# Patient Record
Sex: Female | Born: 1970 | Race: Black or African American | Hispanic: No | Marital: Single | Smoking: Never smoker
Health system: Southern US, Community
[De-identification: ages and names within clinical notes are randomized; demographics above are authoritative.]

## PROBLEM LIST (undated history)

## (undated) HISTORY — PX: ORTHOPEDIC SURGERY: SHX850

## (undated) HISTORY — PX: CHOLECYSTECTOMY: SHX55

---

## 2005-09-21 ENCOUNTER — Ambulatory Visit: Payer: Self-pay

## 2007-06-06 ENCOUNTER — Ambulatory Visit: Payer: Self-pay | Admitting: Family Medicine

## 2008-04-23 ENCOUNTER — Ambulatory Visit: Payer: Self-pay

## 2009-02-24 ENCOUNTER — Ambulatory Visit: Payer: Self-pay | Admitting: Family Medicine

## 2009-02-25 ENCOUNTER — Ambulatory Visit: Payer: Self-pay | Admitting: Surgery

## 2010-02-18 ENCOUNTER — Ambulatory Visit: Payer: Self-pay

## 2010-10-11 ENCOUNTER — Ambulatory Visit: Payer: Self-pay | Admitting: General Practice

## 2010-10-14 ENCOUNTER — Ambulatory Visit: Payer: Self-pay

## 2011-04-11 ENCOUNTER — Ambulatory Visit: Payer: Self-pay

## 2012-05-28 ENCOUNTER — Ambulatory Visit: Payer: Self-pay | Admitting: Obstetrics and Gynecology

## 2013-03-21 IMAGING — CT CT OF THE RIGHT ELBOW WITHOUT CONTRAST
2 of 3 series · 15 of 29 positions shown, 19 images · non-contrast
Comparison: none

REASON FOR EXAM: radial head fracture  3D reconstruction  CR  [phone]
COMMENTS:

PROCEDURE:     CT  - CT ELBOW RIGHT WO  - October 14, 2010 [DATE]
RESULT:     Comparison: Elbow radiographs 10/11/2010
TECHNIQUE: Multiple axial images obtained of the elbow, without intravenous
contrast. Coronal, sagittal, and 3-D reconstructions were performed.

[Series 3: bone windows · axial · 0.35mm/px · z∈[+48,+159]mm · 10 of 45 slices shown, 13 images]
[im 4/45  soft-tissue]
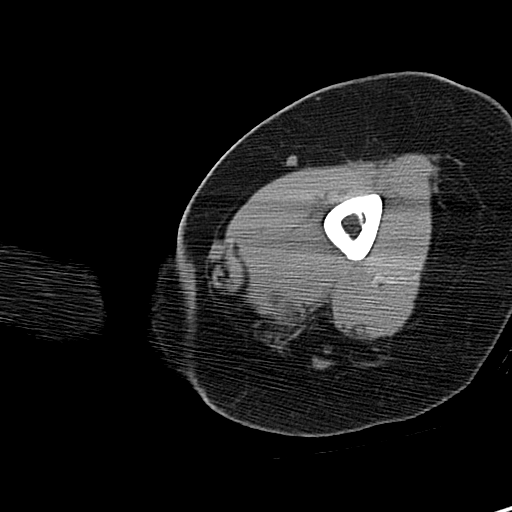
[im 4/45  bone]
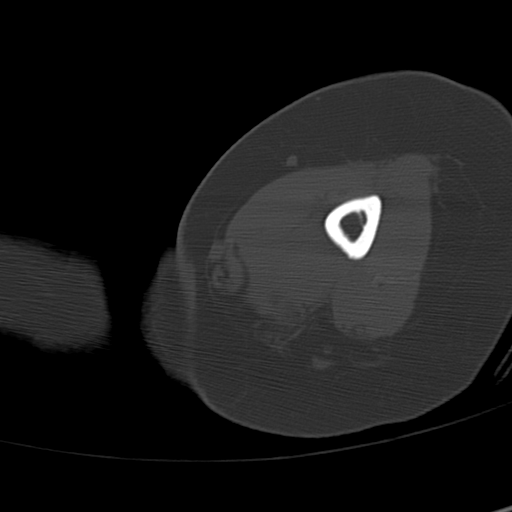
[im 8/45  bone]
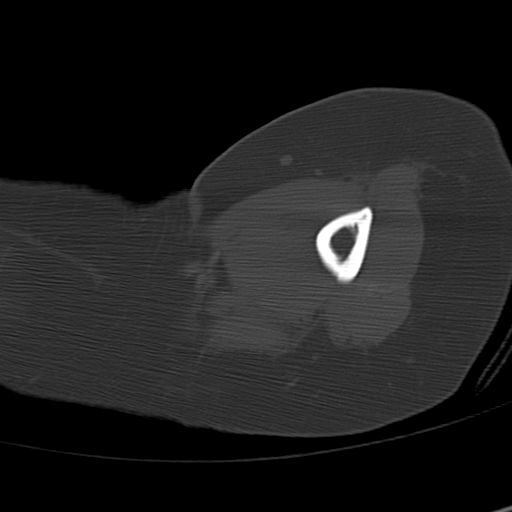
[im 12/45  bone]
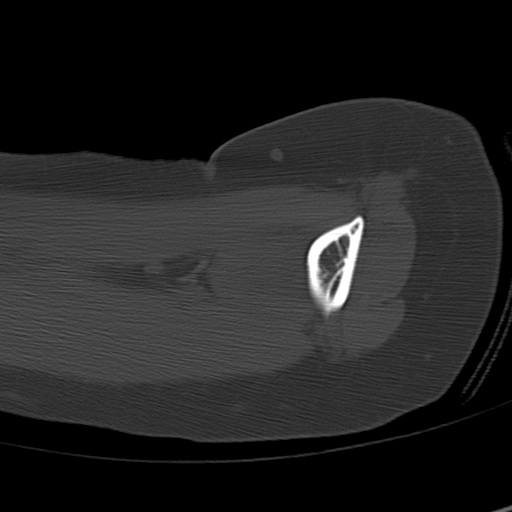
[im 15/45  bone]
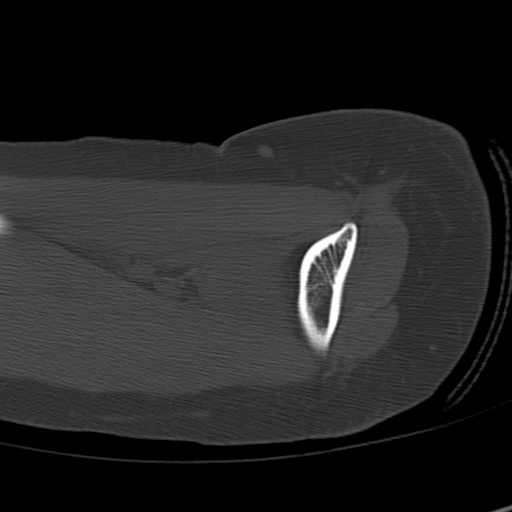
[im 19/45  soft-tissue]
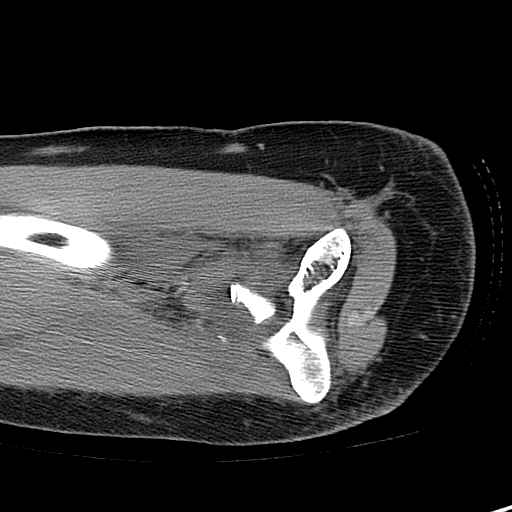
[im 19/45  bone]
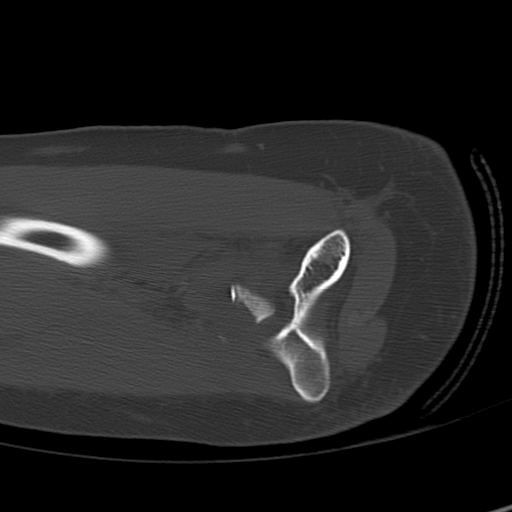
[im 26/45  bone]
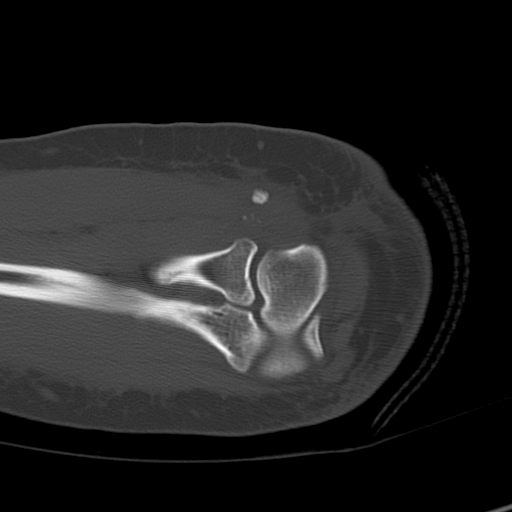
[im 30/45  bone]
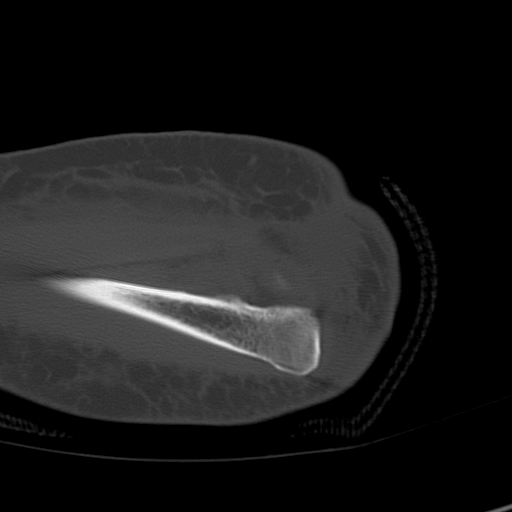
[im 34/45  bone]
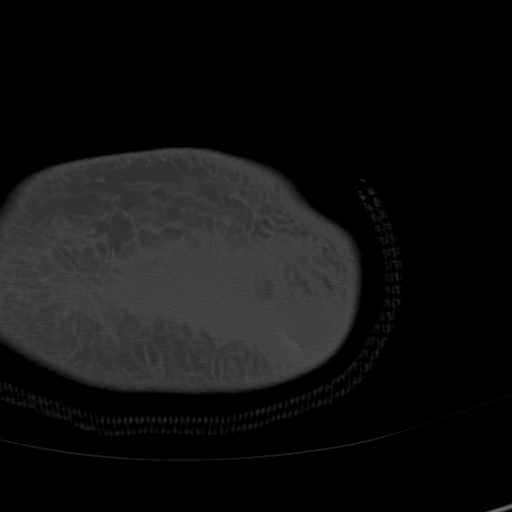
[im 37/45  soft-tissue]
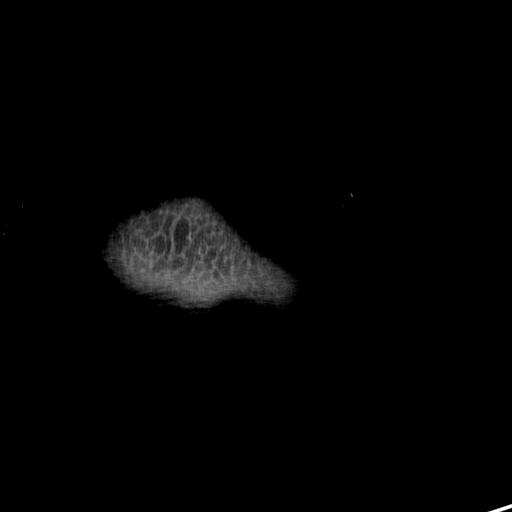
[im 37/45  bone]
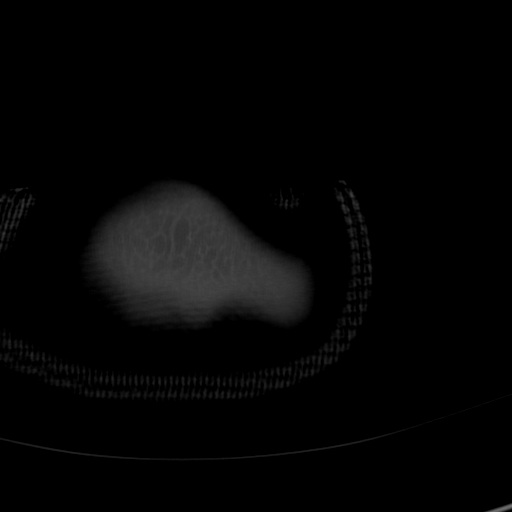
[im 41/45  bone]
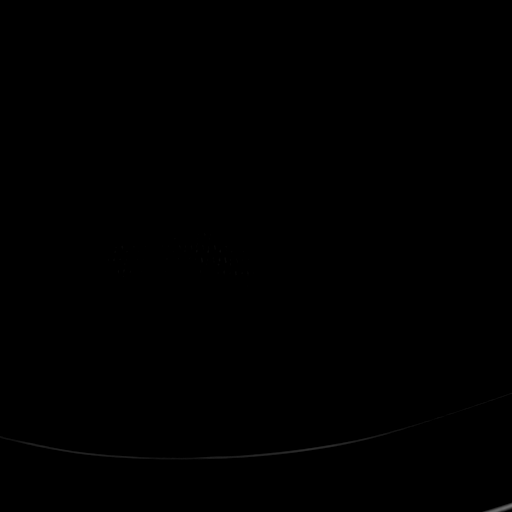

[Series 7: sag · sagittal · 0.24mm/px · 5 of 139 slices shown, 6 images]
[im 47/139  bone]
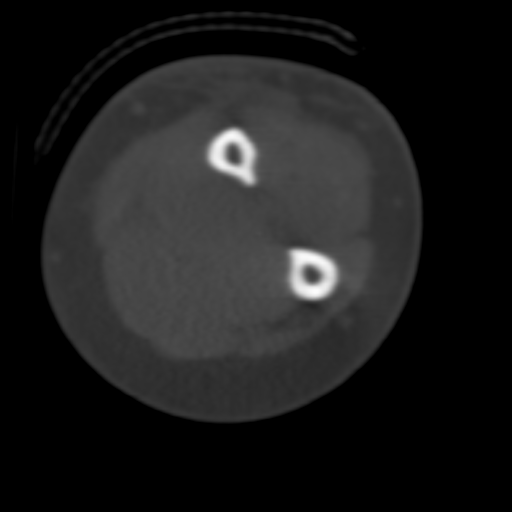
[im 58/139  bone]
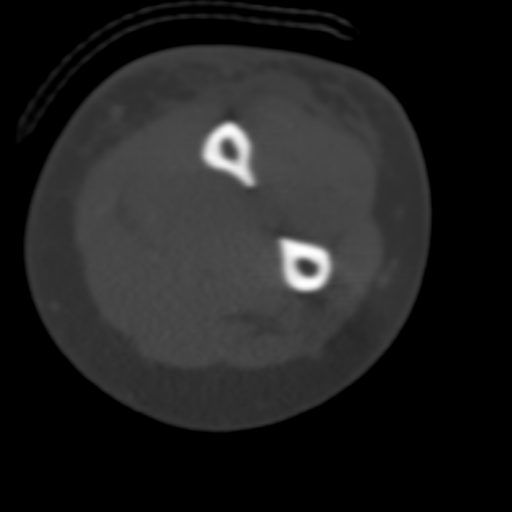
[im 70/139  soft-tissue]
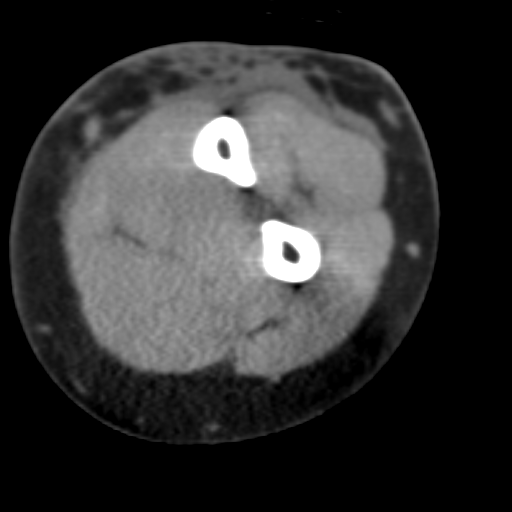
[im 70/139  bone]
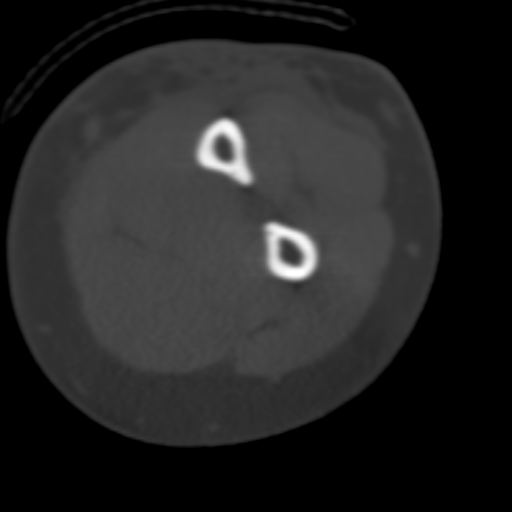
[im 81/139  bone]
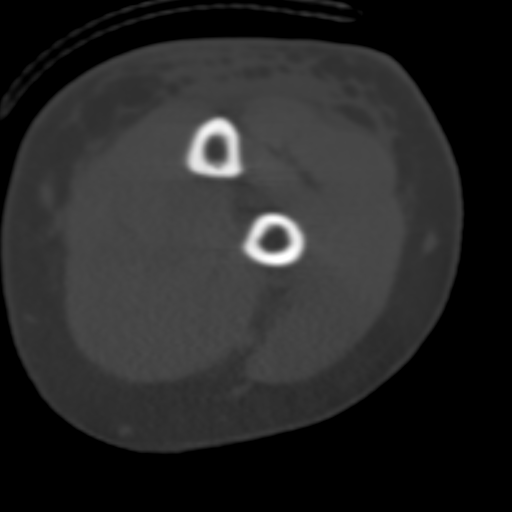
[im 93/139  bone]
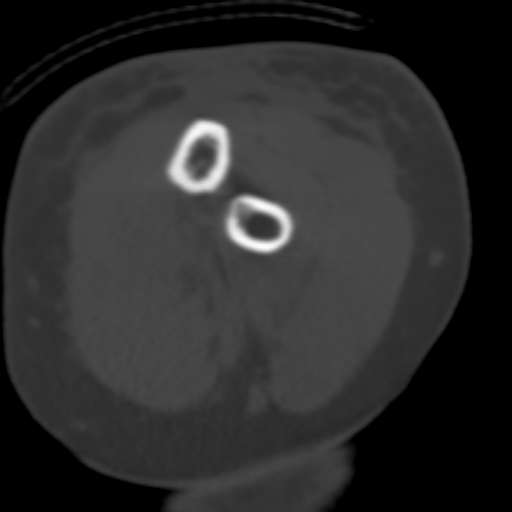

[15 of 29 positions shown; findings below may reference images not displayed]

FINDINGS: There is a comminuted, displaced fracture of the radial head. A large
fragment of the radial head articular surface is displaced anteriorly, and
rotated. Additionally, there is a displaced fracture of the radial side of
the coronoid process. The fracture fragment is displaced anteriorly.

There are several small fracture fragments in the soft tissues lateral to
the radial head.
IMPRESSION: 1. Comminuted, displaced intra-articular radial head fracture.
2. Displaced fracture of the coronoid process.

## 2013-05-29 ENCOUNTER — Ambulatory Visit: Payer: Self-pay | Admitting: Obstetrics and Gynecology

## 2014-05-12 ENCOUNTER — Other Ambulatory Visit: Payer: Self-pay | Admitting: Obstetrics and Gynecology

## 2014-05-12 DIAGNOSIS — Z1231 Encounter for screening mammogram for malignant neoplasm of breast: Secondary | ICD-10-CM

## 2014-06-01 ENCOUNTER — Ambulatory Visit
Admission: RE | Admit: 2014-06-01 | Discharge: 2014-06-01 | Disposition: A | Discharge status: Home / Self Care | Payer: 59 | Source: Ambulatory Visit | Attending: Obstetrics and Gynecology | Admitting: Obstetrics and Gynecology

## 2014-06-01 DIAGNOSIS — Z1231 Encounter for screening mammogram for malignant neoplasm of breast: Secondary | ICD-10-CM | POA: Insufficient documentation

## 2015-07-26 ENCOUNTER — Other Ambulatory Visit: Payer: Self-pay | Admitting: Obstetrics and Gynecology

## 2015-07-26 DIAGNOSIS — Z1231 Encounter for screening mammogram for malignant neoplasm of breast: Secondary | ICD-10-CM

## 2015-08-12 ENCOUNTER — Other Ambulatory Visit: Payer: Self-pay | Admitting: Obstetrics and Gynecology

## 2015-08-12 ENCOUNTER — Ambulatory Visit
Admission: RE | Admit: 2015-08-12 | Discharge: 2015-08-12 | Disposition: A | Discharge status: Home / Self Care | Payer: 59 | Source: Ambulatory Visit | Attending: Obstetrics and Gynecology | Admitting: Obstetrics and Gynecology

## 2015-08-12 DIAGNOSIS — Z1231 Encounter for screening mammogram for malignant neoplasm of breast: Secondary | ICD-10-CM

## 2016-08-28 ENCOUNTER — Other Ambulatory Visit: Payer: Self-pay | Admitting: Obstetrics and Gynecology

## 2016-08-28 DIAGNOSIS — Z1231 Encounter for screening mammogram for malignant neoplasm of breast: Secondary | ICD-10-CM

## 2016-08-31 ENCOUNTER — Ambulatory Visit
Admission: RE | Admit: 2016-08-31 | Discharge: 2016-08-31 | Disposition: A | Discharge status: Home / Self Care | Payer: 59 | Source: Ambulatory Visit | Attending: Obstetrics and Gynecology | Admitting: Obstetrics and Gynecology

## 2016-08-31 DIAGNOSIS — Z1231 Encounter for screening mammogram for malignant neoplasm of breast: Secondary | ICD-10-CM | POA: Diagnosis present

## 2017-03-24 ENCOUNTER — Ambulatory Visit
Admission: EM | Admit: 2017-03-24 | Discharge: 2017-03-24 | Disposition: A | Discharge status: Home / Self Care | Payer: Managed Care, Other (non HMO) | Attending: Family Medicine | Admitting: Family Medicine

## 2017-03-24 ENCOUNTER — Other Ambulatory Visit: Payer: Self-pay

## 2017-03-24 DIAGNOSIS — R0981 Nasal congestion: Secondary | ICD-10-CM

## 2017-03-24 DIAGNOSIS — R509 Fever, unspecified: Secondary | ICD-10-CM

## 2017-03-24 DIAGNOSIS — R05 Cough: Secondary | ICD-10-CM

## 2017-03-24 DIAGNOSIS — J111 Influenza due to unidentified influenza virus with other respiratory manifestations: Secondary | ICD-10-CM

## 2017-03-24 LAB — RAPID INFLUENZA A&B ANTIGENS: Influenza B (ARMC): NEGATIVE

## 2017-03-24 LAB — RAPID INFLUENZA A&B ANTIGENS (ARMC ONLY): INFLUENZA A (ARMC): POSITIVE — AB

## 2017-03-24 MED ORDER — ACETAMINOPHEN 500 MG PO TABS
1000.0000 mg | ORAL_TABLET | Freq: Once | ORAL | Status: AC
  Administered 2017-03-24: 1000 mg via ORAL

## 2017-03-24 MED ORDER — BENZONATATE 200 MG PO CAPS: ORAL_CAPSULE | ORAL | 0 refills | Status: AC

## 2017-03-24 MED ORDER — OSELTAMIVIR PHOSPHATE 75 MG PO CAPS: 75.0000 mg | ORAL_CAPSULE | Freq: Two times a day (BID) | ORAL | 0 refills | Status: AC

## 2017-03-24 NOTE — ED Triage Notes (Signed)
Pt with fever, cough, runny nose and chest congestion.

## 2017-03-24 NOTE — ED Provider Notes (Signed)
[facility]    CSN: [id number] Arrival date & time: 03/24/17  0802     History   Chief Complaint Chief Complaint  Patient presents with  . Fever    HPI Renee Juarez is a 47 y.o. female.   HPI  [age] year old female presents with the sudden onset yesterday of fever cough runny nose and chest congestion.  Works at [employer] and states that there has been an epidemic of flu in her office.  Does not have much body ache but certainly has the respiratory component and chest congestion.  Fever of 103.1 with a pulse rate of 115.  O2 sats on room air is 100%.  She did not receive her flu shot this year is never had the flu.      History reviewed. No pertinent past medical history.  There are no active problems to display for this patient.   Past Surgical History:  Procedure Laterality Date  . CHOLECYSTECTOMY    . ORTHOPEDIC SURGERY      OB History    No data available       Home Medications    Prior to Admission medications   Medication Sig Start Date End Date Taking? Authorizing Provider  levonorgestrel (MIRENA) 20 MCG/24HR IUD 1 each by Intrauterine route once.   Yes [provider]  benzonatate (TESSALON) 200 MG capsule Take one cap TID PRN cough 03/24/17   Lutricia Feiloemer, Skylynne Schlechter P, PA-C  oseltamivir (TAMIFLU) 75 MG capsule Take 1 capsule (75 mg total) by mouth every 12 (twelve) hours. 03/24/17   Lutricia Feiloemer, Jazia Faraci P, PA-C    Family History Family History  Problem Relation Age of Onset  . Breast cancer Maternal Aunt        under 50  . Breast cancer Mother        under 50  . Breast cancer Maternal Grandmother        over 4250    Social History Social History   Tobacco Use  . Smoking status: Never Smoker  . Smokeless tobacco: Never Used  Substance Use Topics  . Alcohol use: No    Frequency: Never  . Drug use: No     Allergies   Penicillins   Review of Systems Review of Systems  Constitutional: Positive for activity change, chills, fatigue  and fever.  HENT: Positive for congestion.   Respiratory: Positive for cough.   All other systems reviewed and are negative.    Physical Exam Triage Vital Signs ED Triage Vitals  Enc Vitals Group     BP 03/24/17 0821 125/72     Pulse Rate 03/24/17 0821 (!) 115     Resp 03/24/17 0821 20     Temp 03/24/17 0821 (!) 103.1 F (39.5 C)     Temp Source 03/24/17 0821 Oral     SpO2 03/24/17 0821 100 %     Weight 03/24/17 0822 240 lb (108.9 kg)     Height 03/24/17 0822 5\' 6"  (1.676 m)     Head Circumference --      Peak Flow --      Pain Score --      Pain Loc --      Pain Edu? --      Excl. in GC? --    No data found.  Updated Vital Signs BP 125/72 (BP Location: Left Arm)   Pulse (!) 115   Temp (!) 103.1 F (39.5 C) (Oral)   Resp 20   Ht 5\' 6"  (  1.676 m)   Wt 240 lb (108.9 kg)   SpO2 100%   BMI 38.74 kg/m   Visual Acuity Right Eye Distance:   Left Eye Distance:   Bilateral Distance:    Right Eye Near:   Left Eye Near:    Bilateral Near:     Physical Exam  Constitutional: She is oriented to person, place, and time. She appears well-developed and well-nourished. No distress.  HENT:  Head: Normocephalic.  Right Ear: External ear normal.  Left Ear: External ear normal.  Nose: Nose normal.  Mouth/Throat: Oropharynx is clear and moist. No oropharyngeal exudate.  Eyes: Pupils are equal, round, and reactive to light. Right eye exhibits no discharge. Left eye exhibits no discharge.  Neck: Normal range of motion.  Pulmonary/Chest: Effort normal and breath sounds normal.  Musculoskeletal: Normal range of motion.  Lymphadenopathy:    She has no cervical adenopathy.  Neurological: She is alert and oriented to person, place, and time.  Skin: Skin is warm and dry. She is not diaphoretic.  Psychiatric: She has a normal mood and affect. Her behavior is normal. Judgment and thought content normal.  Nursing note and vitals reviewed.    UC Treatments / Results  Labs (all  labs ordered are listed, but only abnormal results are displayed) Labs Reviewed  RAPID INFLUENZA A&B ANTIGENS ([facility] ONLY) - Abnormal; Notable for the following components:      Result Value   Influenza A ([facility]) POSITIVE (*)    All other components within normal limits    EKG  EKG Interpretation None       Radiology No results found.  Procedures Procedures (including critical care time)  Medications Ordered in UC Medications  acetaminophen (TYLENOL) tablet 1,000 mg (1,000 mg Oral Given 03/24/17 1610)     Initial Impression / Assessment and Plan / UC Course  I have reviewed the triage vital signs and the nursing notes.  Pertinent labs & imaging results that were available during my care of the patient were reviewed by me and considered in my medical decision making (see chart for details).     Plan: 1. Test/x-ray results and diagnosis reviewed with patient 2. rx as per orders; risks, benefits, potential side effects reviewed with patient 3. Recommend supportive treatment with rest and fluids.  Tylenol or Motrin for body aches and fever.  She has opted to Tamiflu.  Worsens she should go to the emergency room or return to our clinic as necessary.  Remain out of work until her fever has broken 4. F/u prn if symptoms worsen or don't improve   Final Clinical Impressions(s) / UC Diagnoses   Final diagnoses:  Influenza    ED Discharge Orders        Ordered    benzonatate (TESSALON) 200 MG capsule     03/24/17 0840    oseltamivir (TAMIFLU) 75 MG capsule  Every 12 hours     03/24/17 0840       Controlled Substance Prescriptions [place] Controlled Substance Registry consulted? Not Applicable   Lutricia Feil, PA-C 03/24/17 9604

## 2017-08-29 ENCOUNTER — Other Ambulatory Visit: Payer: Self-pay | Admitting: Obstetrics and Gynecology

## 2017-08-29 DIAGNOSIS — Z1231 Encounter for screening mammogram for malignant neoplasm of breast: Secondary | ICD-10-CM

## 2017-09-18 ENCOUNTER — Ambulatory Visit
Admission: RE | Admit: 2017-09-18 | Discharge: 2017-09-18 | Disposition: A | Discharge status: Home / Self Care | Payer: Managed Care, Other (non HMO) | Source: Ambulatory Visit | Attending: Obstetrics and Gynecology | Admitting: Obstetrics and Gynecology

## 2017-09-18 DIAGNOSIS — Z1231 Encounter for screening mammogram for malignant neoplasm of breast: Secondary | ICD-10-CM | POA: Diagnosis present

## 2018-08-14 ENCOUNTER — Other Ambulatory Visit: Payer: Self-pay | Admitting: Obstetrics and Gynecology

## 2018-08-14 DIAGNOSIS — Z1231 Encounter for screening mammogram for malignant neoplasm of breast: Secondary | ICD-10-CM

## 2018-09-20 ENCOUNTER — Ambulatory Visit
Admission: RE | Admit: 2018-09-20 | Discharge: 2018-09-20 | Disposition: A | Discharge status: Home / Self Care | Payer: Managed Care, Other (non HMO) | Source: Ambulatory Visit | Attending: Obstetrics and Gynecology | Admitting: Obstetrics and Gynecology

## 2018-09-20 DIAGNOSIS — Z1231 Encounter for screening mammogram for malignant neoplasm of breast: Secondary | ICD-10-CM | POA: Insufficient documentation

## 2018-09-25 ENCOUNTER — Other Ambulatory Visit: Payer: Self-pay | Admitting: Obstetrics and Gynecology

## 2018-09-25 DIAGNOSIS — R928 Other abnormal and inconclusive findings on diagnostic imaging of breast: Secondary | ICD-10-CM

## 2018-09-25 DIAGNOSIS — N6320 Unspecified lump in the left breast, unspecified quadrant: Secondary | ICD-10-CM

## 2018-10-03 ENCOUNTER — Other Ambulatory Visit: Payer: Self-pay | Admitting: Obstetrics and Gynecology

## 2018-10-03 ENCOUNTER — Ambulatory Visit
Admission: RE | Admit: 2018-10-03 | Discharge: 2018-10-03 | Disposition: A | Discharge status: Home / Self Care | Payer: Managed Care, Other (non HMO) | Source: Ambulatory Visit | Attending: Obstetrics and Gynecology | Admitting: Obstetrics and Gynecology

## 2018-10-03 DIAGNOSIS — R928 Other abnormal and inconclusive findings on diagnostic imaging of breast: Secondary | ICD-10-CM | POA: Insufficient documentation

## 2018-10-03 DIAGNOSIS — N6320 Unspecified lump in the left breast, unspecified quadrant: Secondary | ICD-10-CM | POA: Insufficient documentation

## 2019-09-09 ENCOUNTER — Other Ambulatory Visit: Payer: Self-pay | Admitting: Obstetrics and Gynecology

## 2019-09-09 DIAGNOSIS — Z1231 Encounter for screening mammogram for malignant neoplasm of breast: Secondary | ICD-10-CM

## 2019-10-06 ENCOUNTER — Ambulatory Visit
Admission: RE | Admit: 2019-10-06 | Discharge: 2019-10-06 | Disposition: A | Discharge status: Home / Self Care | Payer: Managed Care, Other (non HMO) | Source: Ambulatory Visit | Attending: Obstetrics and Gynecology | Admitting: Obstetrics and Gynecology

## 2019-10-06 ENCOUNTER — Other Ambulatory Visit: Payer: Self-pay

## 2019-10-06 DIAGNOSIS — Z1231 Encounter for screening mammogram for malignant neoplasm of breast: Secondary | ICD-10-CM

## 2020-08-31 ENCOUNTER — Other Ambulatory Visit: Payer: Self-pay | Admitting: Obstetrics and Gynecology

## 2020-08-31 DIAGNOSIS — Z1231 Encounter for screening mammogram for malignant neoplasm of breast: Secondary | ICD-10-CM

## 2020-10-06 ENCOUNTER — Other Ambulatory Visit: Payer: Self-pay

## 2020-10-06 ENCOUNTER — Ambulatory Visit
Admission: RE | Admit: 2020-10-06 | Discharge: 2020-10-06 | Disposition: A | Discharge status: Home / Self Care | Payer: Managed Care, Other (non HMO) | Source: Ambulatory Visit | Attending: Obstetrics and Gynecology | Admitting: Obstetrics and Gynecology

## 2020-10-06 DIAGNOSIS — Z1231 Encounter for screening mammogram for malignant neoplasm of breast: Secondary | ICD-10-CM | POA: Insufficient documentation

## 2021-09-05 ENCOUNTER — Other Ambulatory Visit: Payer: Self-pay | Admitting: Obstetrics and Gynecology

## 2021-09-05 DIAGNOSIS — Z1231 Encounter for screening mammogram for malignant neoplasm of breast: Secondary | ICD-10-CM

## 2021-10-07 ENCOUNTER — Ambulatory Visit
Admission: RE | Admit: 2021-10-07 | Discharge: 2021-10-07 | Disposition: A | Discharge status: Home / Self Care | Payer: Managed Care, Other (non HMO) | Source: Ambulatory Visit | Attending: Obstetrics and Gynecology | Admitting: Obstetrics and Gynecology

## 2021-10-07 DIAGNOSIS — Z1231 Encounter for screening mammogram for malignant neoplasm of breast: Secondary | ICD-10-CM | POA: Diagnosis not present

## 2022-10-10 ENCOUNTER — Other Ambulatory Visit: Payer: Self-pay

## 2022-10-10 DIAGNOSIS — Z1231 Encounter for screening mammogram for malignant neoplasm of breast: Secondary | ICD-10-CM

## 2022-10-20 ENCOUNTER — Ambulatory Visit
Admission: RE | Admit: 2022-10-20 | Discharge: 2022-10-20 | Disposition: A | Discharge status: Home / Self Care | Payer: Managed Care, Other (non HMO) | Source: Ambulatory Visit

## 2022-10-20 DIAGNOSIS — Z1231 Encounter for screening mammogram for malignant neoplasm of breast: Secondary | ICD-10-CM | POA: Diagnosis present

## 2023-11-06 ENCOUNTER — Other Ambulatory Visit: Payer: Self-pay | Admitting: Student

## 2023-11-06 DIAGNOSIS — Z1231 Encounter for screening mammogram for malignant neoplasm of breast: Secondary | ICD-10-CM

## 2023-11-08 ENCOUNTER — Ambulatory Visit
Admission: RE | Admit: 2023-11-08 | Discharge: 2023-11-08 | Disposition: A | Discharge status: Home / Self Care | Source: Ambulatory Visit | Attending: Student | Admitting: Student

## 2023-11-08 DIAGNOSIS — Z1231 Encounter for screening mammogram for malignant neoplasm of breast: Secondary | ICD-10-CM | POA: Insufficient documentation
